# Patient Record
Sex: Female | Born: 1985 | Race: Black or African American | Hispanic: No | Marital: Single | Smoking: Never smoker
Health system: Southern US, Community
[De-identification: ages and names within clinical notes are randomized; demographics above are authoritative.]

---

## 2017-03-06 ENCOUNTER — Emergency Department
Admission: EM | Admit: 2017-03-06 | Discharge: 2017-03-06 | Disposition: A | Discharge status: Home / Self Care | Payer: Medicaid Other | Attending: Emergency Medicine | Admitting: Emergency Medicine

## 2017-03-06 ENCOUNTER — Emergency Department: Payer: Medicaid Other

## 2017-03-06 DIAGNOSIS — R079 Chest pain, unspecified: Secondary | ICD-10-CM

## 2017-03-06 DIAGNOSIS — R0789 Other chest pain: Secondary | ICD-10-CM | POA: Insufficient documentation

## 2017-03-06 LAB — CBC AND DIFFERENTIAL
Absolute NRBC: 0 10*3/uL
Basophils Absolute Automated: 0.02 10*3/uL (ref 0.00–0.20)
Basophils Automated: 0.5 %
Eosinophils Absolute Automated: 0.09 10*3/uL (ref 0.00–0.70)
Eosinophils Automated: 2.1 %
Hematocrit: 41.1 % (ref 37.0–47.0)
Hgb: 13.4 g/dL (ref 12.0–16.0)
Immature Granulocytes Absolute: 0.02 10*3/uL
Immature Granulocytes: 0.5 %
Lymphocytes Absolute Automated: 1.78 10*3/uL (ref 0.50–4.40)
Lymphocytes Automated: 41.8 %
MCH: 28.2 pg (ref 28.0–32.0)
MCHC: 32.6 g/dL (ref 32.0–36.0)
MCV: 86.3 fL (ref 80.0–100.0)
MPV: 13.6 fL — ABNORMAL HIGH (ref 9.4–12.3)
Monocytes Absolute Automated: 0.28 10*3/uL (ref 0.00–1.20)
Monocytes: 6.6 %
Neutrophils Absolute: 2.07 10*3/uL (ref 1.80–8.10)
Neutrophils: 48.5 %
Nucleated RBC: 0 /100 WBC (ref 0.0–1.0)
Platelets: 178 10*3/uL (ref 140–400)
RBC: 4.76 10*6/uL (ref 4.20–5.40)
RDW: 14 % (ref 12–15)
WBC: 4.26 10*3/uL (ref 3.50–10.80)

## 2017-03-06 LAB — BASIC METABOLIC PANEL
BUN: 8 mg/dL (ref 7.0–19.0)
CO2: 23 mEq/L (ref 22–29)
Calcium: 9.6 mg/dL (ref 8.5–10.5)
Chloride: 106 mEq/L (ref 100–111)
Creatinine: 1 mg/dL (ref 0.6–1.0)
Glucose: 128 mg/dL — ABNORMAL HIGH (ref 70–100)
Potassium: 4.1 mEq/L (ref 3.5–5.1)
Sodium: 139 mEq/L (ref 136–145)

## 2017-03-06 LAB — I-STAT TROPONIN: i-STAT Troponin: 0 ng/mL (ref 0.00–0.09)

## 2017-03-06 LAB — IHS D-DIMER: D-Dimer: 0.33 ug/mL FEU (ref 0.00–0.50)

## 2017-03-06 LAB — PT AND APTT
PT INR: 1 (ref 0.9–1.1)
PT: 13.3 s (ref 12.6–15.0)
PTT: 27 s (ref 23–37)

## 2017-03-06 LAB — ECG 12-LEAD
Atrial Rate: 88 {beats}/min
P Axis: 56 degrees
P-R Interval: 152 ms
Q-T Interval: 310 ms
QRS Duration: 86 ms
QTC Calculation (Bezet): 375 ms
R Axis: 27 degrees
T Axis: 45 degrees
Ventricular Rate: 88 {beats}/min

## 2017-03-06 LAB — GFR: EGFR: >60

## 2017-03-06 MED ORDER — ASPIRIN 325 MG PO TABS
325.0000 mg | ORAL_TABLET | Freq: Once | ORAL | Status: AC
Start: 2017-03-06 — End: 2017-03-06
  Administered 2017-03-06: 13:00:00 325 mg via ORAL
  Filled 2017-03-06: qty 1

## 2017-03-06 MED ORDER — SODIUM CHLORIDE 0.9 % IV BOLUS
1000.0000 mL | Freq: Once | INTRAVENOUS | Status: AC
Start: 2017-03-06 — End: 2017-03-06
  Administered 2017-03-06: 13:00:00 1000 mL via INTRAVENOUS

## 2017-03-06 NOTE — Discharge Instructions (Signed)
Dear Ms. Nyland:    Thank you for choosing the [facility] Emergency Department, the premier emergency department in the [place] area.  I hope your visit today was EXCELLENT.    Specific instructions for your visit today:    Please follow up with a cardiologist.     Chest Pain of Unclear Etiology    You have been seen for chest pain. The cause of your pain is not yet known.    Your doctor has learned about your medical history, examined you, and checked any tests that were done. Still, it is unclear why you are having pain. The doctor thinks there is only a very small chance that your pain is caused by a life-threatening condition. Later, your primary care doctor might do more tests or check you again.    Sometimes chest pain is caused by a dangerous condition, like a heart attack, aorta injury, blood clot in the lung, or collapsed lung. It is unlikely that your pain is caused by a life-threatening condition if: Your chest pain lasts only a few seconds at a time; you are not short of breath, nauseated (sick to your stomach), sweaty, or lightheaded; your pain gets worse when you twist or bend; your pain improves with exercise or hard work.    Chest pain is serious. It is VERY IMPORTANT that you follow up with your regular doctor and seek medical attention immediately here or at the nearest Emergency Department if your symptoms become worse or they change.    YOU SHOULD SEEK MEDICAL ATTENTION IMMEDIATELY, EITHER HERE OR AT THE NEAREST EMERGENCY DEPARTMENT, IF ANY OF THE FOLLOWING OCCURS:   Your pain gets worse.   Your pain makes you short of breath, nauseated, or sweaty.   Your pain gets worse when you walk, Brandye Inthavong up stairs, or exert yourself.   You feel weak, lightheaded, or faint.   It hurts to breathe.   Your leg swells.   Your symptoms get worse or you have new symptoms or concerns.                 If you do not continue to improve or your condition worsens, please contact your doctor or return  immediately to the Emergency Department.    Sincerely,  Rudi Rummage, MD  Attending Emergency Physician  [facility] Emergency Department    ONSITE PHARMACY  Our full service onsite pharmacy is located in the ER waiting room.  Open 7 days a week from 9 am to 11 pm.  We accept all major insurances and prices are competitive with major retailers.  Ask your provider to print your prescriptions down to the pharmacy to speed you on your way home.    OBTAINING A PRIMARY CARE APPOINTMENT    Primary care physicians (PCPs, also known as primary care doctors) are either internists or family medicine doctors. Both types of PCPs focus on health promotion, disease prevention, patient education and counseling, and treatment of acute and chronic medical conditions.    Call for an appointment with a primary care doctor.  Ask to see who is taking new patients.     [facility]  telephone:  [phone]  [email or web address]    DOCTOR REFERRALS  Call [phone] (available 24 hours a day, 7 days a week) if you need any further referrals and we can help you find a primary care doctor or specialist.  Also, available online at:  [email or web address]    YOUR CONTACT INFORMATION  Before leaving please check with registration to make sure we have an up-to-date contact number.  You can call registration at [phone] to update your information.  For questions about your hospital bill, please call [phone].  For questions about your Emergency Dept Physician bill please call [phone].      [place]  If you need help with health or social services, please call 2-1-1 for a free referral to resources in your area.  2-1-1 is a free service connecting people with information on health insurance, free clinics, pregnancy, mental health, dental care, food assistance, housing, and substance abuse counseling.  Also, available online at:  http://www.[place].org    MEDICAL RECORDS AND  TESTS  Certain laboratory test results do not come back the same day, for example urine cultures.   We will contact you if other important findings are noted.  Radiology films are often reviewed again to ensure accuracy.  If there is any discrepancy, we will notify you.      Please call [phone] to pick up a complimentary CD of any radiology studies performed.  If you or your doctor would like to request a copy of your medical records, please call [phone].      ORTHOPEDIC INJURY   Please know that significant injuries can exist even when an initial x-ray is read as normal or negative.  This can occur because some fractures (broken bones) are not initially visible on x-rays.  For this reason, close outpatient follow-up with your primary care doctor or bone specialist (orthopedist) is required.    MEDICATIONS AND FOLLOWUP  Please be aware that some prescription medications can cause drowsiness.  Use caution when driving or operating machinery.    The examination and treatment you have received in our Emergency Department is provided on an emergency basis, and is not intended to be a substitute for your primary care physician.  It is important that your doctor checks you again and that you report any new or remaining problems at that time.      [place]  The nearest 24 hour pharmacy is:    [facility] [address] [place], [place] [address]  [place]  [facility])  Call to start or finish an application, compare plans, enroll or ask a question.  [place]: [phone]  Web:  Healthcare.gov    Help Enrolling in [place]  646-688-4000 (TOLL-FREE)  [phone] (TTY)  Web:  Http://www.coverva.org    Local Help Enrolling in the [place]  (575) [phone] (MAIN)  Email:  health-help@nvfs .org  Web:  http://lewis-perez.info/  Address:  [address] [place], [place]  [address]    SEDATING MEDICATIONS  Sedating medications include strong pain medications (e.g. narcotics), muscle relaxers, benzodiazepines (used for anxiety and as muscle relaxers), Benadryl/diphenhydramine and other antihistamines for allergic reactions/itching, and other medications.  If you are unsure if you have received a sedating medication, please ask your physician or nurse.  If you received a sedating medication: DO NOT drive a car. DO NOT operate machinery. DO NOT perform jobs where you need to be alert.  DO NOT drink alcoholic beverages while taking this medicine.     If you get dizzy, sit or lie down at the first signs. Be careful going up and down stairs.  Be extra careful to prevent falls.  Never give this medicine to others.     Keep this medicine out of reach of children.     Do not take or save old medicines. Throw them away when outdated.     Keep all medicines in a cool, dry place. DO NOT keep them in your bathroom medicine cabinet or in a cabinet above the stove.    MEDICATION REFILLS  Please be aware that we cannot refill any prescriptions through the ER. If you need further treatment from what is provided at your ER visit, please follow up with your primary care doctor or your pain management specialist.    [place]  Did you know [facility] has two freestanding ERs located just a few miles away?  [place] [facility] [place] [facility] [place] have short wait times, easy free parking directly in front of the building and top patient satisfaction scores - and the same Board Certified Emergency Medicine doctors as [facility].

## 2017-03-06 NOTE — ED Provider Notes (Signed)
[facility] EMERGENCY DEPARTMENT H&P      Visit date: 03/06/2017      CLINICAL SUMMARY          Diagnosis:    .     Final diagnoses:   Chest pain, unspecified type         MDM Notes:      31 y.o. female PMH: HCL (takes no meds) who presents with chest pressure.  Pt was driving and felt the pressure.  This has happened before and passed out.  Pt clearly has anterior chest wall tenderness to palpation during exam.   Labs, EKG and CXR are benign.     HEART score 3.  Pt to f/u as an outpt. IMG cardio info provided.  Pt given strict return precautions.  She verbalized understanding and agreement with plan.     DDx: arrythmia, PE, ACS, musculoskeletal pain  Plan: fluids, labs, EKG, CXR. pain management and reassess            Disposition:         Discharge         Discharge Prescriptions     None                      CLINICAL INFORMATION        HPI:      Chief Complaint: Chest Pain  .    Abigail Hunter is a 31 y.o. female pmhx of HLD not on meds who presents with chest pain a/w lightheadedness and dizziness. Pt was driving to work around 1610 AM today, when she had CP pressure and tightness that radiated to her neck and Hunter shoulder. Pt notes lifting her arm produced pain so she drove only with her R hand.    Pt notes of having this sx previously, and says she had passed out while driving that time. Denies hx of DM, HTN, blood clot. Denies SOB. S/p tubal ligation. No recent travel. Pt is not currently followed by a cardiologist.     Context: while driving  Location: chest  Duration: 2 hours  Quality: pressure/tightness  Timing: persistent  Maximum Severity: moderate  Modifying Factors: worsens with pressing down chest    History obtained from: Patient          ROS:      Positive and negative ROS elements as per HPI.  All other systems reviewed and negative.      Physical Exam:      Pulse 98  BP 123/84  Resp 18  SpO2 98 %  Temp 98.5 F (36.9 C)    Physical Exam   Constitutional: She is  oriented to person, place, and time. She appears well-developed and well-nourished.   HENT:   Head: Normocephalic and atraumatic.   Eyes: Pupils are equal, round, and reactive to light. EOM are normal.   Neck: Normal range of motion. Neck supple.   Cardiovascular: Normal rate and regular rhythm.    Pulmonary/Chest: Effort normal and breath sounds normal. No respiratory distress. She has no wheezes. She has no rales.   Abdominal: Soft. Bowel sounds are normal. She exhibits no distension. There is no tenderness.   Musculoskeletal: Normal range of motion. She exhibits no edema or tenderness.   palpation to anterior chest reproduces CP.   full ROM of Hunter U extremity at this time.    Neurological: She is alert and oriented to person, place, and time.   Skin: Skin is warm and dry.  Psychiatric: She has a normal mood and affect. Her behavior is normal.   Nursing note and vitals reviewed.                PAST HISTORY        Primary Care Provider: Darcey Nora, MD        PMH/PSH:    .     History reviewed. No pertinent past medical history.    She has no past surgical history on file.      Social/Family History:      She reports that she has never smoked. She has never used smokeless tobacco. She reports that she does not use drugs. Her alcohol history is not on file.    History reviewed. No pertinent family history.      Listed Medications on Arrival:    .     Home Medications     Med List Status:  In Progress Set By: Zack Seal, RN at 03/06/2017 11:46 AM        No Medications         Allergies: She has No Known Allergies.            VISIT INFORMATION        Clinical Course in the ED:             Heart Score for Chest Pain Patients Score   History Highly Suspicious 2 points 1    Moderately Suspicious 1 point     Slightly or Non-Suspicious 0 point    ECG Significant ST Depression 2 points 1    Nonspecific Repolarization 1 point     Normal 0 point    Age Greater than or equal to 65 years 2 points 0    Greater than  45 and less than 65 years 1 point     Less than or equal to 45 years 0 point    Risk Factors Greater than 3 risk factors or History of CAD 2 points 1    1 or 2 Risk Factors 1 point     No Risk Factors 0 point    Troponin Greater than or equal to 3 times normal limit 2 points 0    Greater than 1 and less than 3 times normal limit 1 point     Less than or equal to normal limit 0 point    Risk Factors: DM, current or recent (less than one month) smoker, HTN, HIP, family history of CAD, and obesity   TOTAL SCORE 3             Medications Given in the ED:    .     ED Medication Orders     Start Ordered     Status Ordering Provider    03/06/17 1225 03/06/17 1224  aspirin tablet 325 mg  Once     Route: Oral  Ordered Dose: 325 mg     Last MAR action:  Given Dio Giller S    03/06/17 1223 03/06/17 1222  sodium chloride 0.9 % bolus 1,000 mL  Once     Route: Intravenous  Ordered Dose: 1,000 mL     Last MAR action:  Stopped Tyronne Blann S            Procedures:      Procedures      Interpretations:      O2 sat-           saturation: 98 %; Oxygen use: room air;  Interpretation: Normal       EKG -             interpreted by me: normal sinus at 88 normal axis. no ST elevation or depression. non-specific ST waves anteriorly. will repeat another one.               RESULTS        Lab Results:      Results     Procedure Component Value Units Date/Time    D-Dimer [[id number]] Collected:  03/06/17 1238     Updated:  03/06/17 1315     D-Dimer 0.33 ug/mL FEU     Basic Metabolic Panel [[id number]]  (Abnormal) Collected:  03/06/17 1238    Specimen:  Blood Updated:  03/06/17 1313     Glucose 128 (H) mg/dL      BUN 8.0 mg/dL      Creatinine 1.0 mg/dL      Calcium 9.6 mg/dL      Sodium 132 mEq/Hunter      Potassium 4.1 mEq/Hunter      Chloride 106 mEq/Hunter      CO2 23 mEq/Hunter     GFR [[id number]] Collected:  03/06/17 1238     Updated:  03/06/17 1313     EGFR >60.0    PT/APTT [[id number]] Collected:  03/06/17 1238     Updated:  03/06/17 1313     PT 13.3 sec       PT INR 1.0     PT Anticoag. Given Within 48 hrs. None     PTT 27 sec     CBC with differential [[id number]]  (Abnormal) Collected:  03/06/17 1238    Specimen:  Blood from Blood Updated:  03/06/17 1311     WBC 4.26 x10 3/uL      Hgb 13.4 g/dL      Hematocrit 56.4 %      Platelets 178 x10 3/uL      RBC 4.76 x10 6/uL      MCV 86.3 fL      MCH 28.2 pg      MCHC 32.6 g/dL      RDW 14 %      MPV 13.6 (H) fL      Neutrophils 48.5 %      Lymphocytes Automated 41.8 %      Monocytes 6.6 %      Eosinophils Automated 2.1 %      Basophils Automated 0.5 %      Immature Granulocyte 0.5 %      Nucleated RBC 0.0 /100 WBC      Neutrophils Absolute 2.07 x10 3/uL      Abs Lymph Automated 1.78 x10 3/uL      Abs Mono Automated 0.28 x10 3/uL      Abs Eos Automated 0.09 x10 3/uL      Absolute Baso Automated 0.02 x10 3/uL      Absolute Immature Granulocyte 0.02 x10 3/uL      Absolute NRBC 0.00 x10 3/uL     i-Stat Troponin [[id number]] Collected:  03/06/17 1154     Updated:  03/06/17 1244     i-STAT Troponin 0.00 ng/mL               Radiology Results:      XR Chest 2 Views   Final Result     Normal study.      Nelta Numbers, MD    03/06/2017 12:58 PM  Scribe Attestation:      I was acting as a Neurosurgeon for Rudi Rummage, MD on Abigail Hunter,Abigail Hunter  Treatment Team: Scribe: Ardeth Perfect, Go Sammie Bench     I am the first provider for this patient and I personally performed the services documented. Treatment Team: Scribe: Ardeth Perfect, Go Sammie Bench is scribing for me on Abigail Hunter,Abigail Hunter. This note and the patient instructions accurately reflect work and decisions made by me.  Rudi Rummage, MD          Rudi Rummage, MD  03/06/17 [phone]

## 2020-08-08 IMAGING — CT CT ABD-PELV W/ CM
2 of 4 series · 16 of 46 positions shown, 18 images · IV contrast (omnipaque)
Comparison: None.

CLINICAL DATA: Lower abdominal pain

EXAM:
CT ABDOMEN AND PELVIS WITH CONTRAST
TECHNIQUE: Multidetector CT imaging of the abdomen and pelvis was performed
using the standard protocol following bolus administration of
intravenous contrast.
CONTRAST:  100mL OMNIPAQUE IOHEXOL 300 MG/ML  SOLN

[Series 2: axial st · axial · 0.82mm/px · z∈[-695,-290]mm · 13 of 91 slices shown, 15 images]
[im 5/91  soft-tissue]
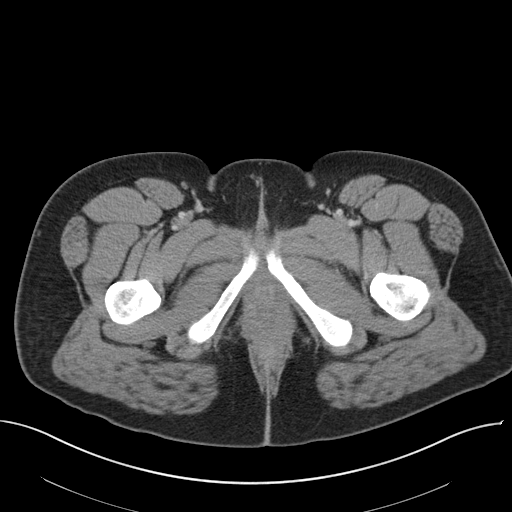
[im 5/91  bone]
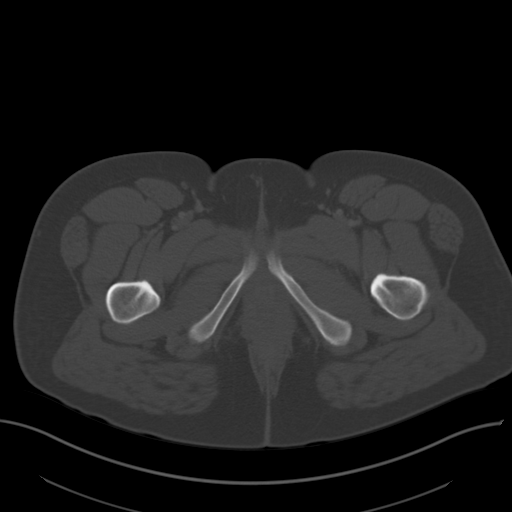
[im 15/91  soft-tissue]
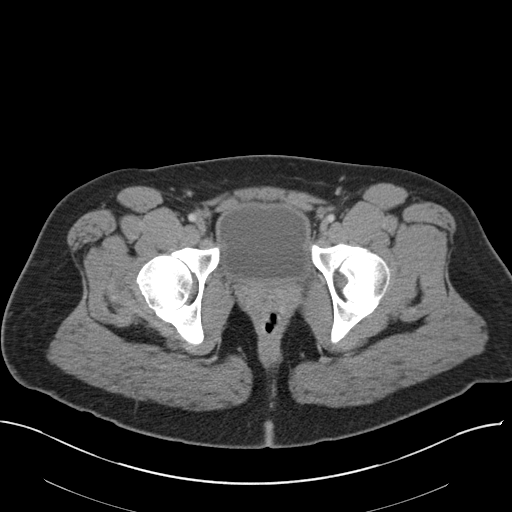
[im 19/91  soft-tissue]
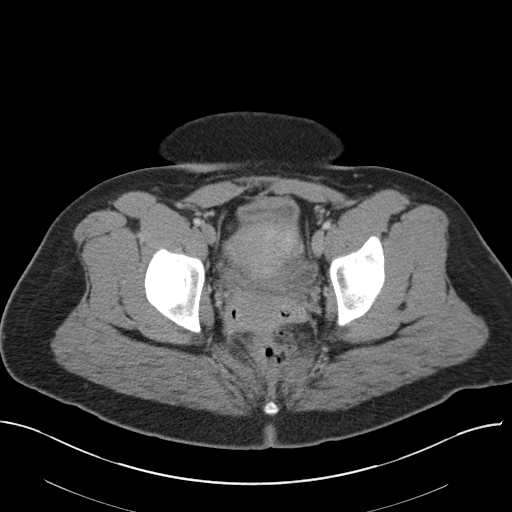
[im 24/91  soft-tissue]
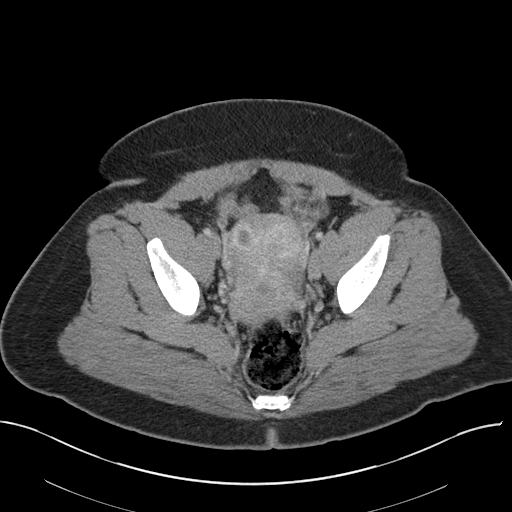
[im 34/91  soft-tissue]
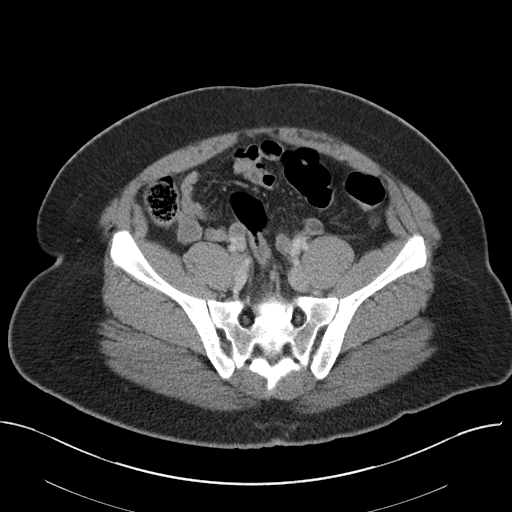
[im 38/91  soft-tissue]
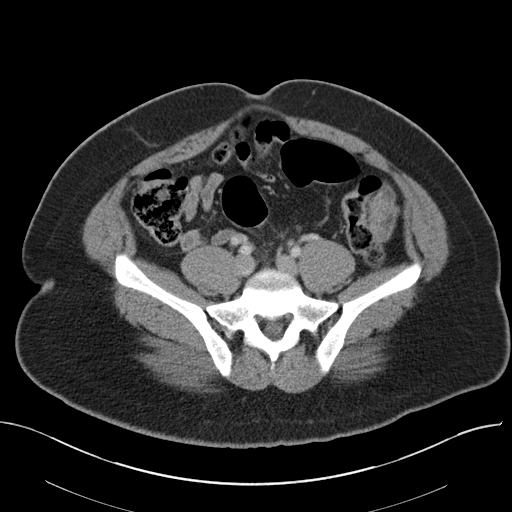
[im 48/91  soft-tissue]
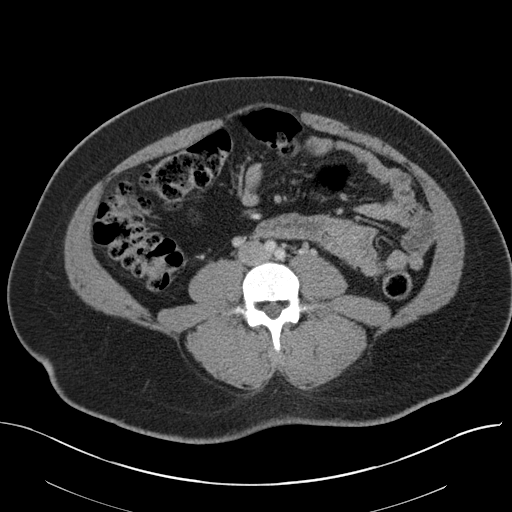
[im 53/91  soft-tissue]
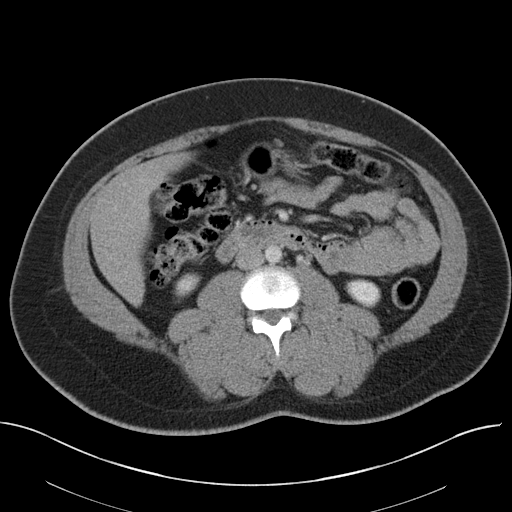
[im 57/91  soft-tissue]
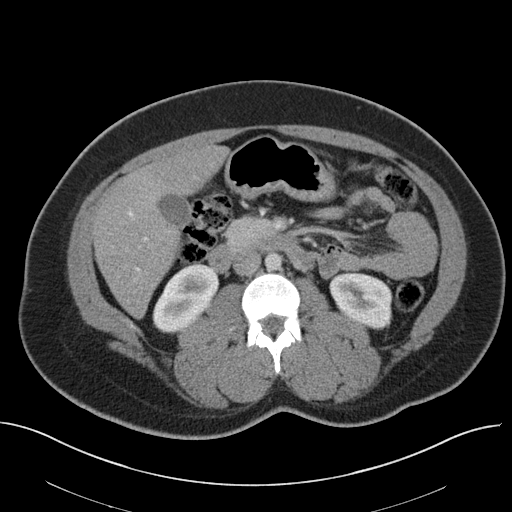
[im 57/91  bone]
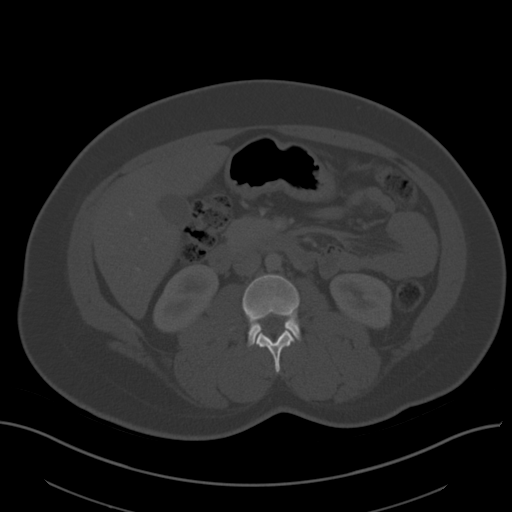
[im 67/91  soft-tissue]
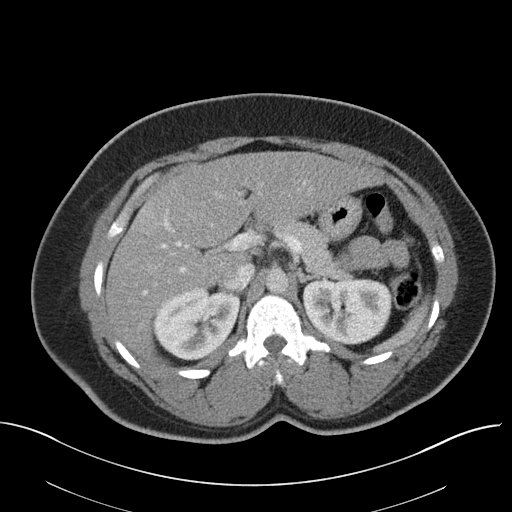
[im 72/91  soft-tissue]
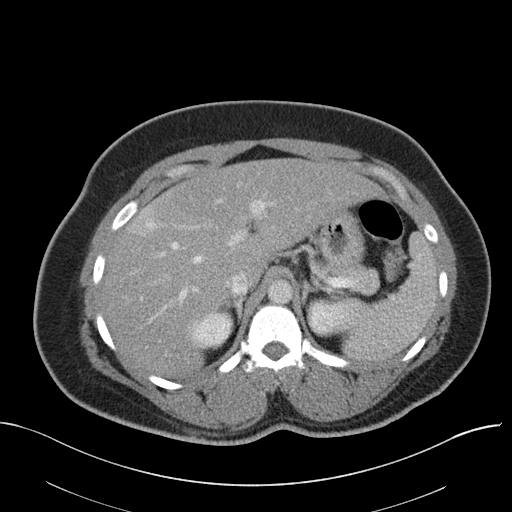
[im 76/91  soft-tissue]
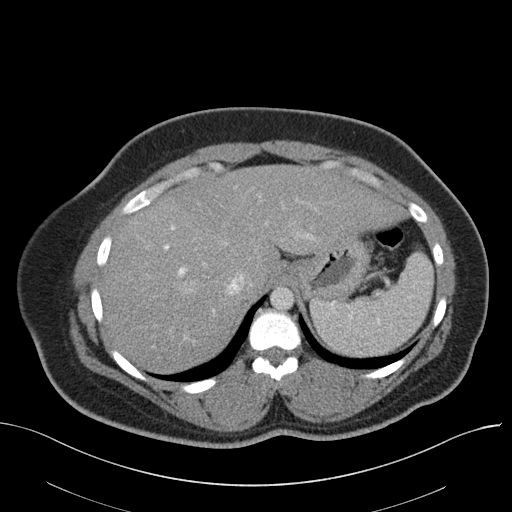
[im 86/91  soft-tissue]
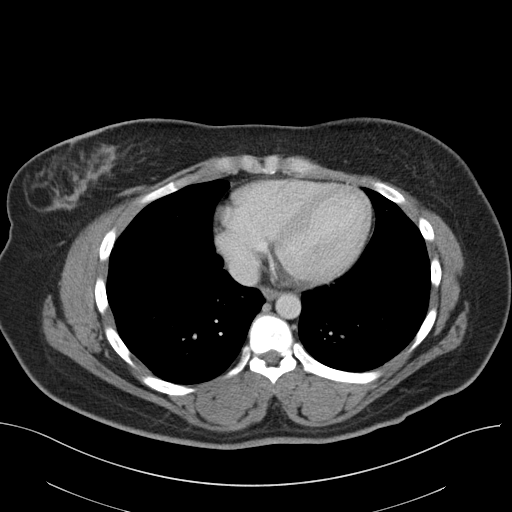

[Series 5: coronal st · coronal · 0.75mm/px · 3 of 155 slices shown]
[im 52/155  soft-tissue]
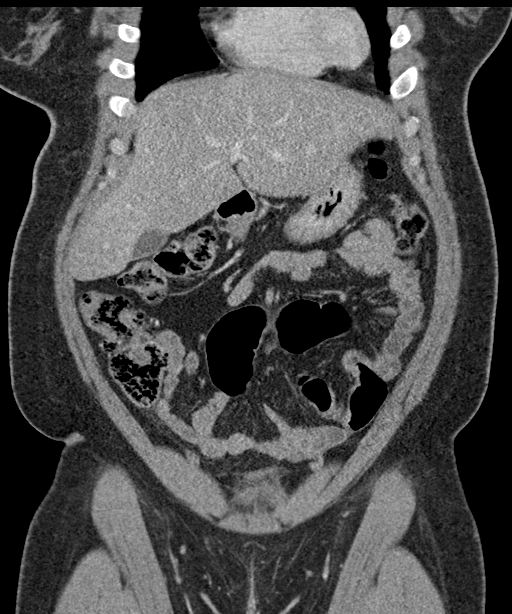
[im 69/155  soft-tissue]
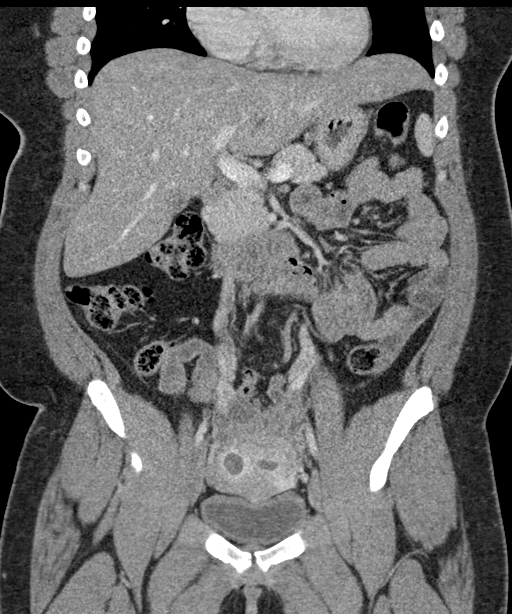
[im 86/155  soft-tissue]
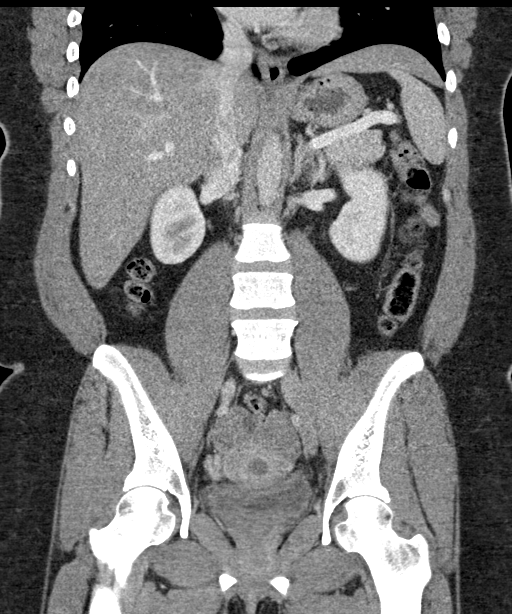

[16 of 46 positions shown; findings below may reference images not displayed]

FINDINGS: Lower chest: Lung bases demonstrate no acute consolidation or
pleural effusion.

Hepatobiliary: Hepatic steatosis. Focal hyperdense areas within the
liver possibly due to geographic fat sparing or altered perfusion.
No calcified gallstone or biliary dilatation

Pancreas: Unremarkable. No pancreatic ductal dilatation or
surrounding inflammatory changes.

Spleen: Normal in size without focal abnormality.

Adrenals/Urinary Tract: Adrenal glands are unremarkable. Kidneys are
normal, without renal calculi, focal lesion, or hydronephrosis.
Bladder is unremarkable.

Stomach/Bowel: Stomach is within normal limits. Appendix appears
normal. No evidence of bowel wall thickening, distention, or
inflammatory changes.

Vascular/Lymphatic: No significant vascular findings are present. No
enlarged abdominal or pelvic lymph nodes.

Reproductive: Possible arcuate configuration of the uterus.
Endometrial thickening up to 19 mm on sagittal views at the right
fundus. Fluid or endometrial thickening in the lower uterine segment
up to 19 mm. Tubular structures within the bilateral adnexa, best
seen on coronal views consistent with hydrosalpinx.

Other: No free fluid or free air.

Musculoskeletal: No acute or significant osseous findings.
IMPRESSION: 1. No CT evidence for acute intra-abdominal or pelvic abnormality.
2. Endometrial thickening up to 19 mm. Tortuous dilated tubular
structures within the right greater than left adnexa suspicious for
bilateral hydrosalpinx. Correlation with pelvic ultrasound is
recommended, with urgency based on clinical presentation and level
of concern.
3. Hepatic steatosis with possible areas of geographic fat sparing.

## 2021-12-13 IMAGING — CR DG CHEST 2V
2 series · 2 of 2 positions shown · non-contrast
Comparison: CT Abdomen and Pelvis 08/05/2020.

CLINICAL DATA: 35-year-old female with chest tightness, flu like
symptoms.

EXAM:
CHEST - 2 VIEW

[w chest pa]
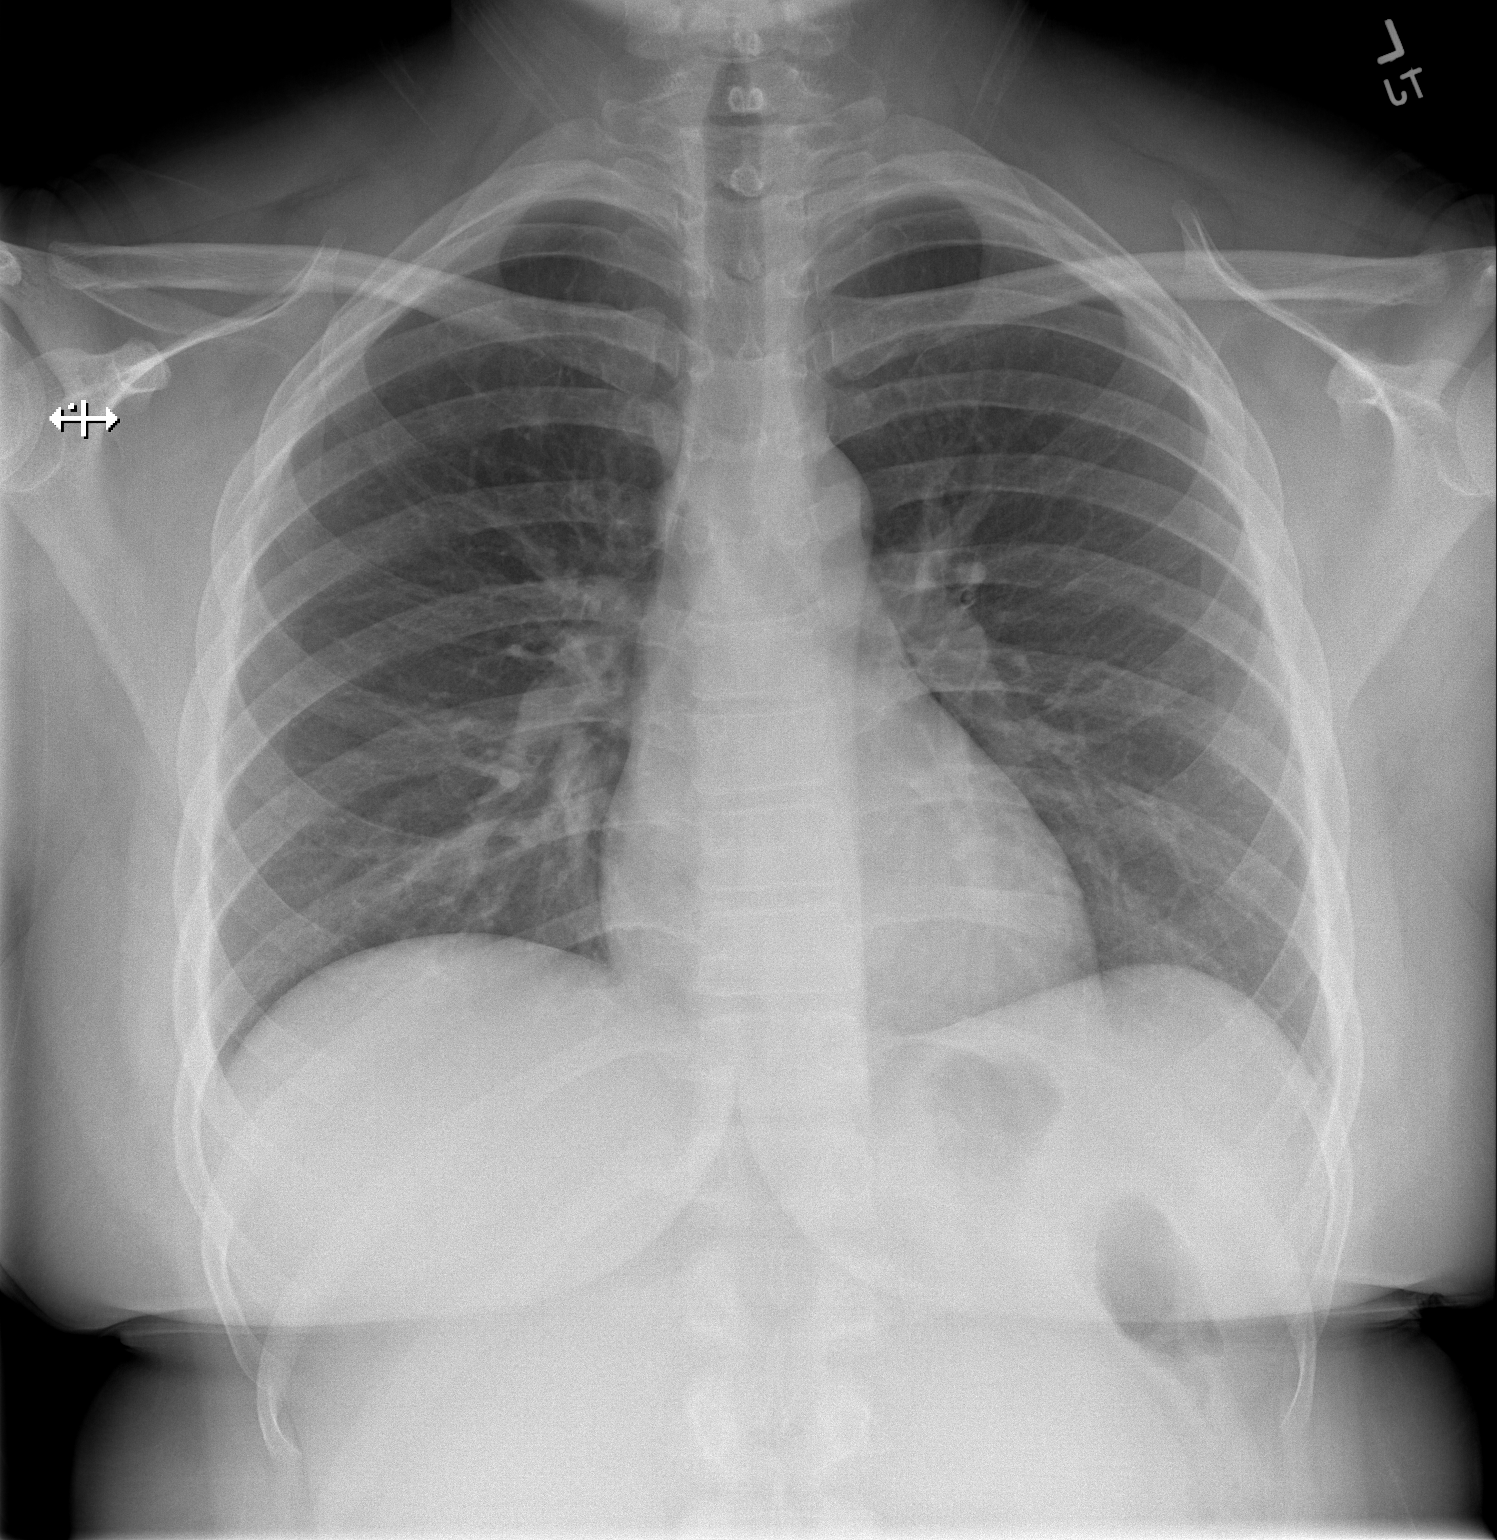

[w chest lat]
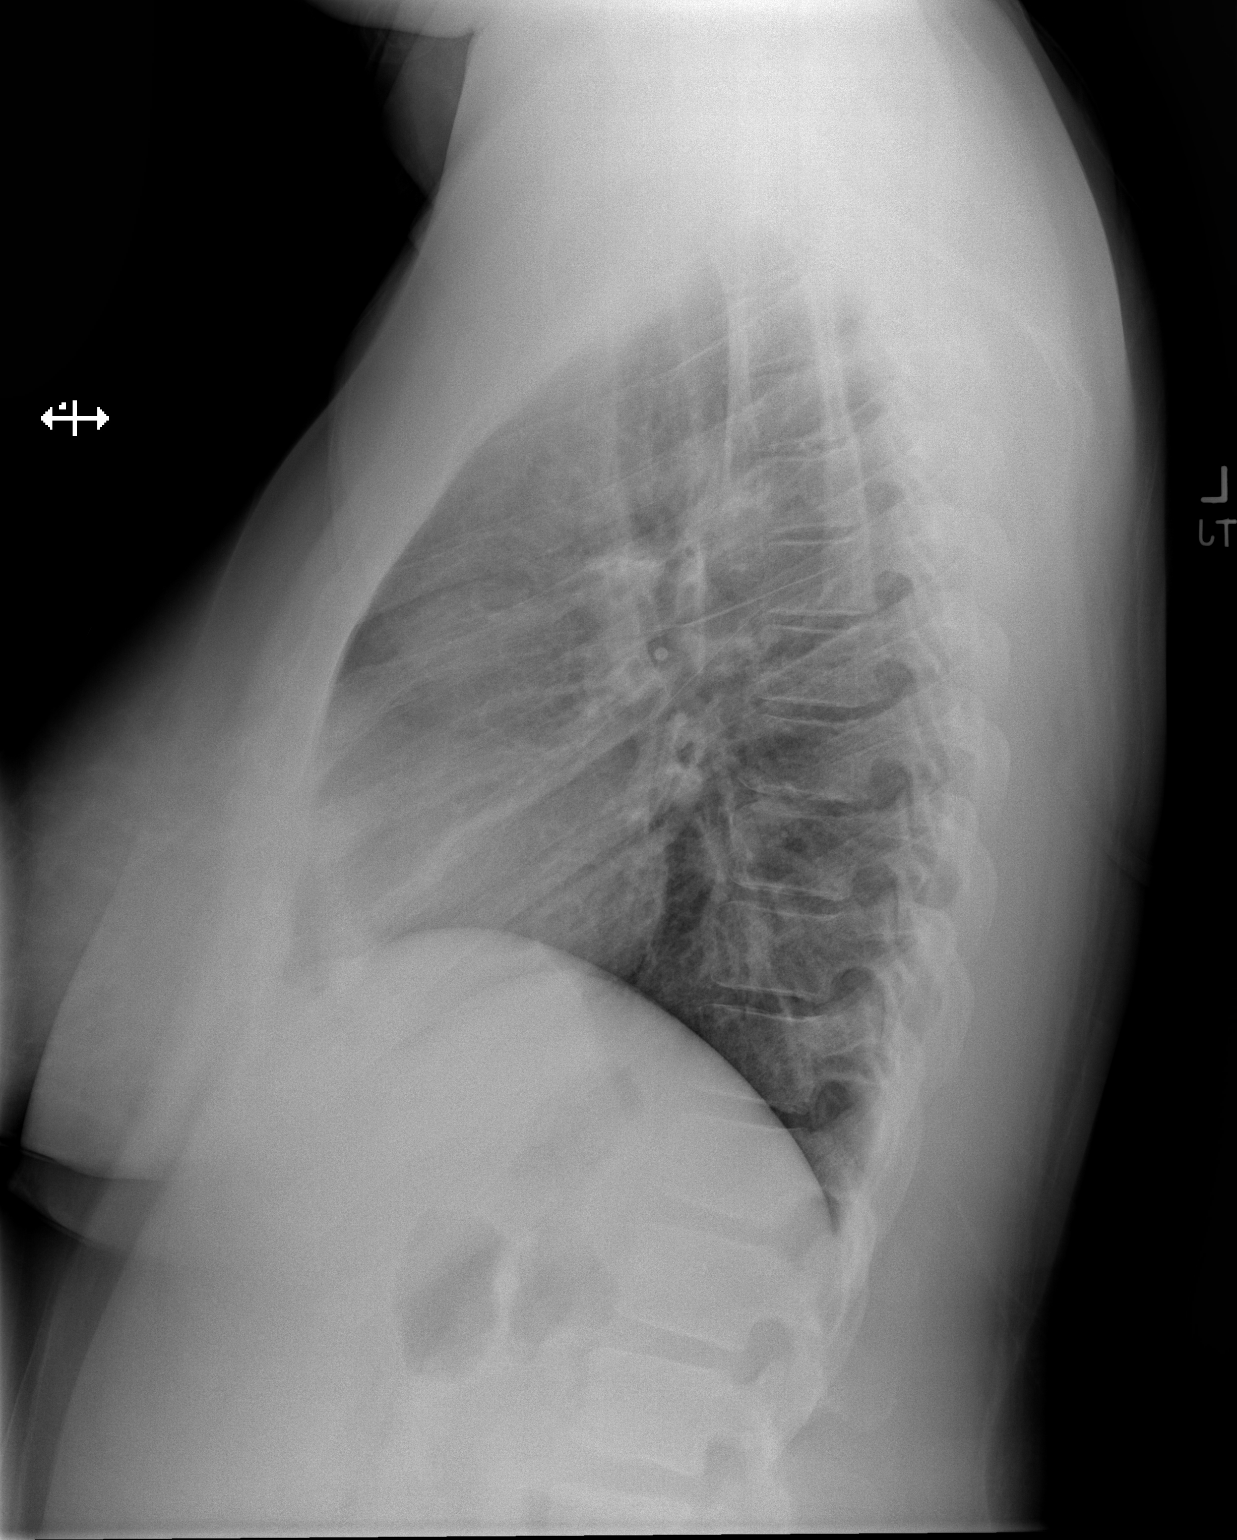

[2 of 2 positions shown; findings below may reference images not displayed]

FINDINGS: Low normal lung volumes. Normal mediastinal contours. Visualized
tracheal air column is within normal limits.

The lungs appear clear aside from subtle asymmetric right lower lung
peribronchial opacity on the PA view. No pleural effusion or
consolidation.

No osseous abnormality identified. Negative visible bowel gas
pattern.
IMPRESSION: Somewhat low lung volumes with subtle asymmetric right lower
peribronchial opacity. Consider atelectasis versus mild
bronchopneumonia in this setting. CT
